# Patient Record
Sex: Male | Born: 1990 | Race: White | Hispanic: No | Marital: Single
Health system: Southern US, Community
[De-identification: ages and names within clinical notes are randomized; demographics above are authoritative.]

---
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
## 2013-02-26 ENCOUNTER — Emergency Department: Payer: Self-pay | Admitting: Emergency Medicine

## 2014-06-06 IMAGING — CR LEFT INDEX FINGER 2+V
1 series · 3 of 3 positions shown · non-contrast
Comparison: none

REASON FOR EXAM: crush injury
COMMENTS:

[Series 1: x finger pa left · 0.14mm/px · 3 of 3 slices shown]
[im 1/3]
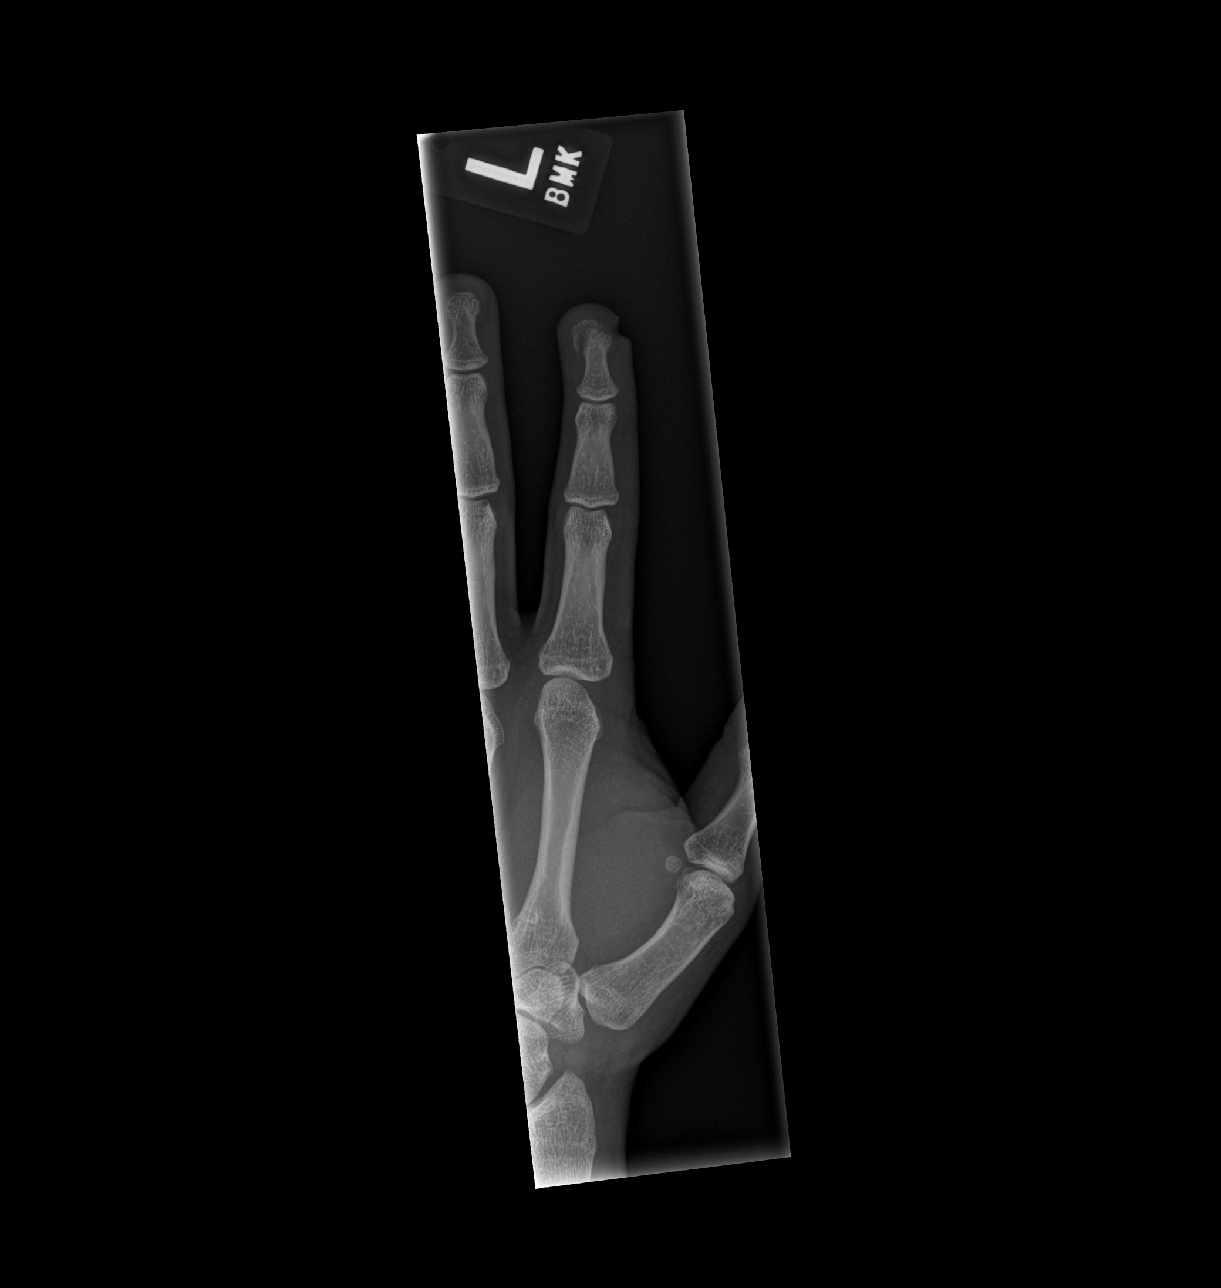
[im 2/3]
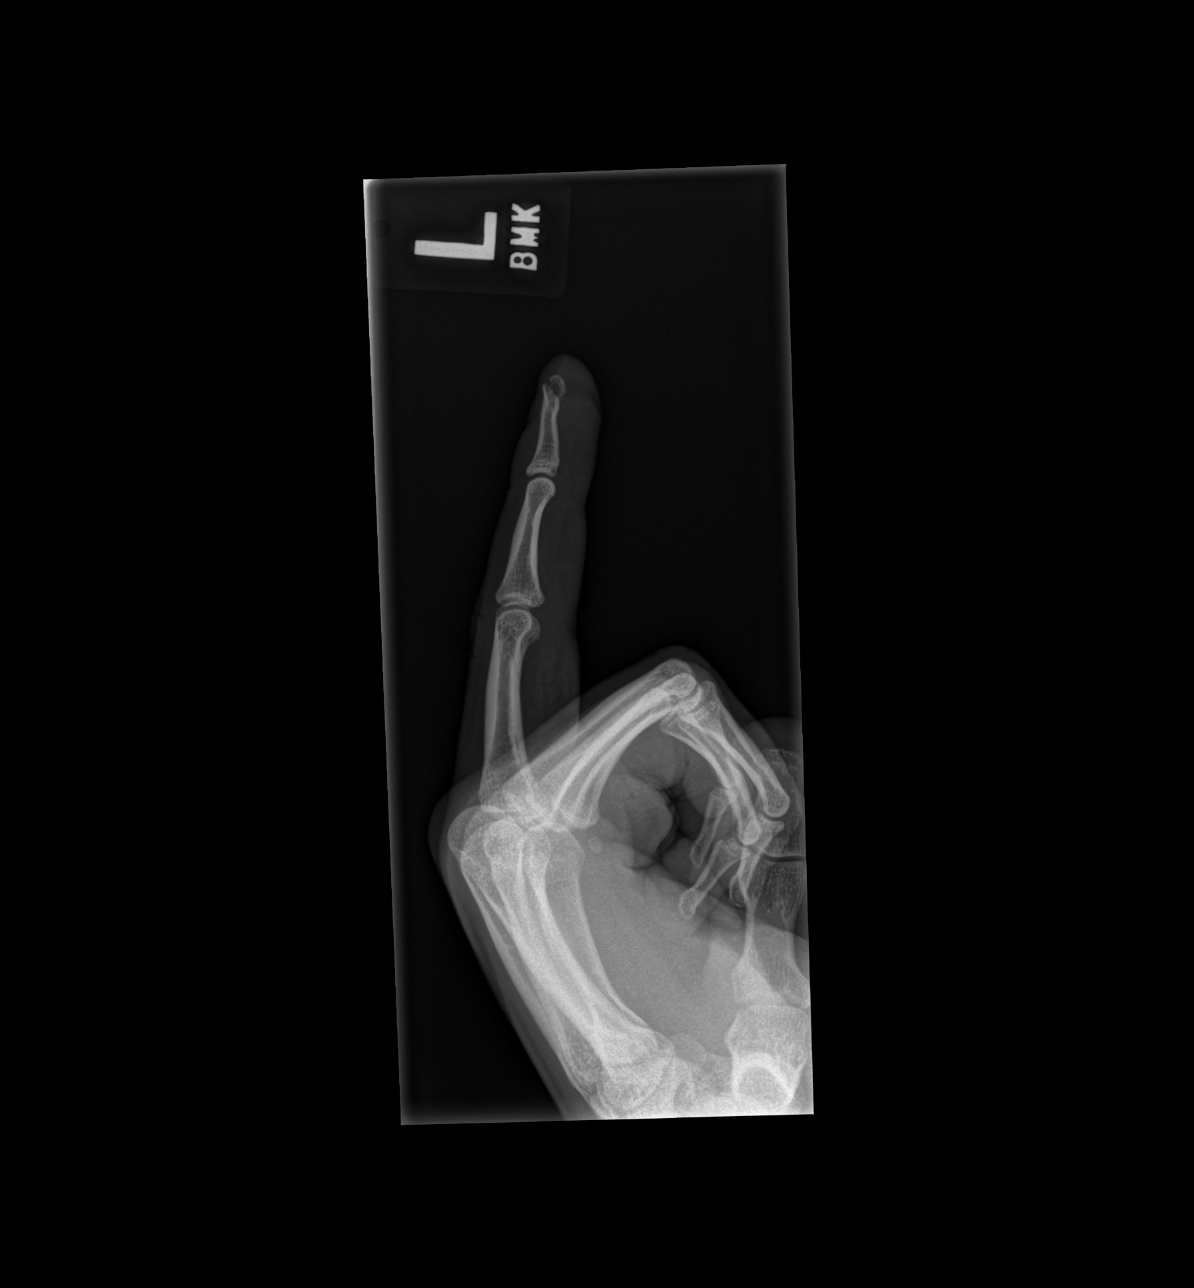
[im 3/3]
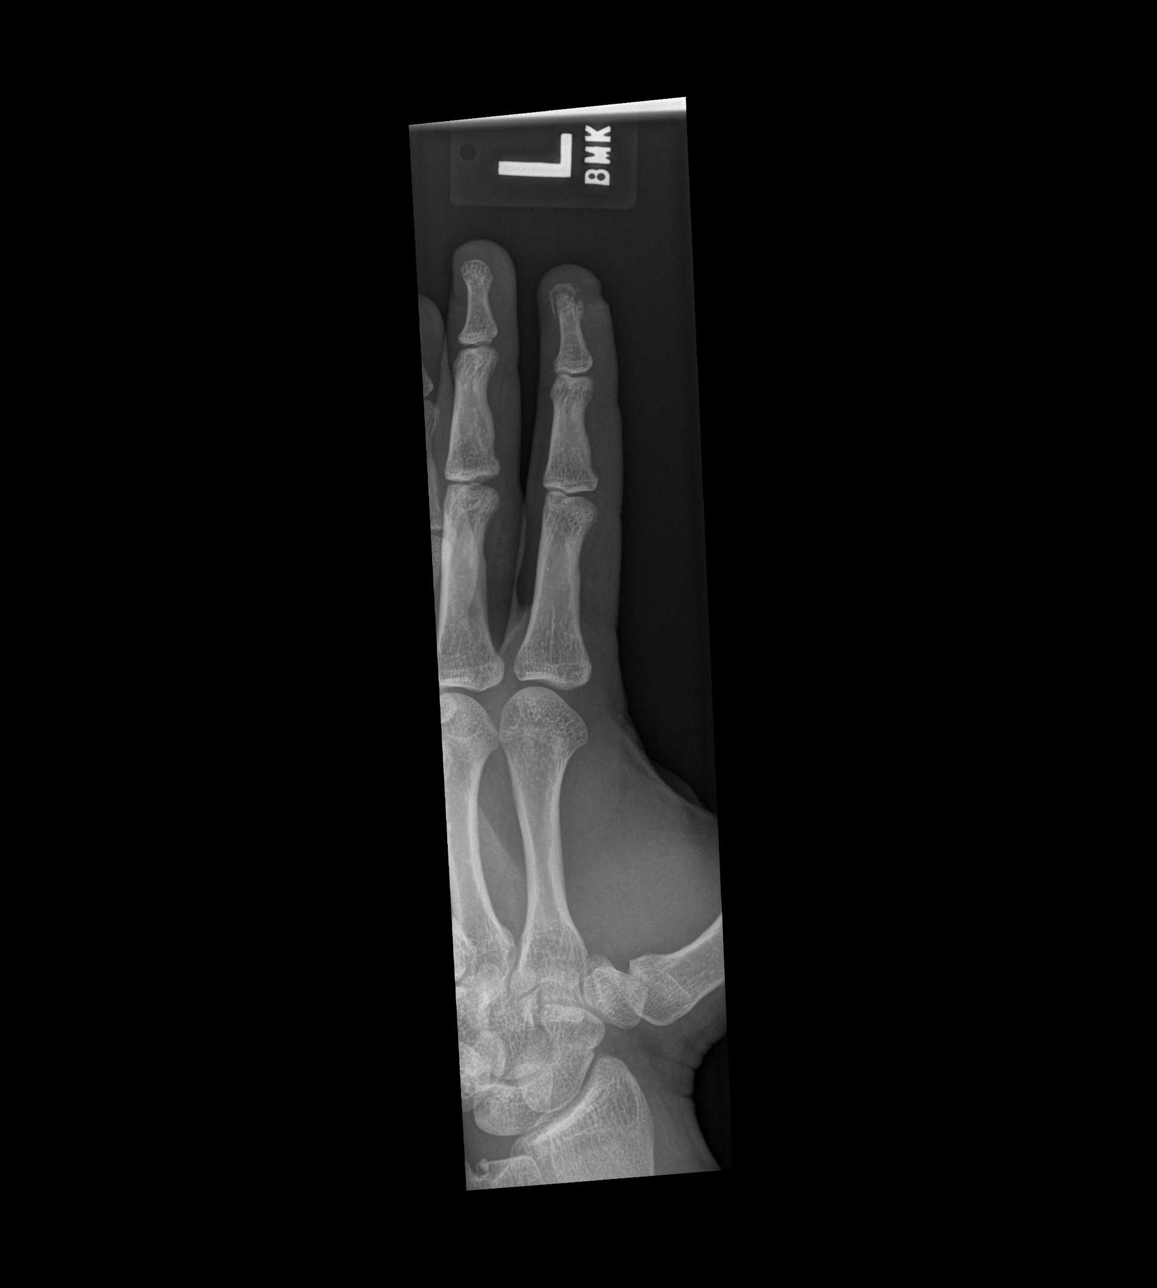

[3 of 3 positions shown; findings below may reference images not displayed]

PROCEDURE:     DXR - DXR FINGER INDEX 2ND DIGIT LT HA  - February 26, 2013  [DATE]

RESULT:     Three views of the left index finger are submitted. There is an
open fracture the tuft of the distal phalanx. The fracture fragment is
mildly distracted from the underlying donor site. The proximal and middle
phalanges appear normal as do the interphalangeal joints.
IMPRESSION: The patient sustained an open fracture of the tuft of the
left index finger.

[REDACTED]

## 2017-01-25 ENCOUNTER — Other Ambulatory Visit: Payer: Self-pay

## 2017-01-25 ENCOUNTER — Ambulatory Visit (INDEPENDENT_AMBULATORY_CARE_PROVIDER_SITE_OTHER): Payer: BLUE CROSS/BLUE SHIELD | Admitting: Podiatry

## 2017-01-25 DIAGNOSIS — M2580 Other specified joint disorders, unspecified joint: Secondary | ICD-10-CM | POA: Diagnosis not present

## 2017-01-25 MED ORDER — DICLOFENAC SODIUM 75 MG PO TBEC: 75.0000 mg | DELAYED_RELEASE_TABLET | Freq: Two times a day (BID) | ORAL | 0 refills | Status: AC

## 2017-01-25 NOTE — Progress Notes (Signed)
   Subjective:    Patient ID: Antonio Mcgrath, male    DOB: February 09, 1991, 26 y.o.   MRN: [id number]  HPI    Review of Systems  All other systems reviewed and are negative.      Objective:   Physical Exam        Assessment & Plan:

## 2017-01-28 NOTE — Progress Notes (Signed)
   HPI: Patient is a 26 year old male presenting today as a new patient with a complaint of pain to the left great toe, forefoot and medial left foot onset 2 months ago. Wearing shoes increases his pain. He has not done anything to treat his symptoms. He is here for further evaluation and treatment.   No past medical history on file.   Physical Exam: General: The patient is alert and oriented x3 in no acute distress.  Dermatology: Skin is warm, dry and supple bilateral lower extremities. Negative for open lesions or macerations.  Vascular: Palpable pedal pulses bilaterally. No edema or erythema noted. Capillary refill within normal limits.  Neurological: Epicritic and protective threshold grossly intact bilaterally.   Musculoskeletal Exam: .Range of motion within normal limits to all pedal and ankle joints bilateral. Muscle strength 5/5 in all groups bilateral. Pain on palpation noted to the sesamoidal apparatus the left foot. Pain with range of motion of the first MTPJ left   Assessment: -  Sesamoiditis left   Plan of Care:  - Patient evaluated. - Injection of 0.5 mLs of Celestone Soluspan injected into the sesamoidal apparatus. - Offloading met pads dispensed. - Prescription for diclofenac given to patient. - Discussed modifying insoles to offload the sesamoids. Also discussed custom molded orthotics.  - Return to clinic in 4 weeks.   Edrick Kins, DPM [facility]  Dr. Edrick Kins, DPM    [address], [place] [address]                Office [phone]  Fax [phone]

## 2017-02-01 MED ORDER — BETAMETHASONE SOD PHOS & ACET 6 (3-3) MG/ML IJ SUSP: 3.0000 mg | Freq: Once | INTRAMUSCULAR | Status: AC

## 2017-02-22 ENCOUNTER — Ambulatory Visit: Payer: BLUE CROSS/BLUE SHIELD | Admitting: Podiatry

## 2017-06-14 ENCOUNTER — Ambulatory Visit: Payer: BLUE CROSS/BLUE SHIELD | Admitting: Podiatry

## 2017-06-28 ENCOUNTER — Encounter: Payer: Self-pay | Admitting: Podiatry

## 2017-06-28 ENCOUNTER — Ambulatory Visit (INDEPENDENT_AMBULATORY_CARE_PROVIDER_SITE_OTHER): Payer: BLUE CROSS/BLUE SHIELD | Admitting: Podiatry

## 2017-06-28 DIAGNOSIS — M2580 Other specified joint disorders, unspecified joint: Secondary | ICD-10-CM

## 2017-06-28 MED ORDER — MELOXICAM 15 MG PO TABS: 15.0000 mg | ORAL_TABLET | Freq: Every day | ORAL | 3 refills | Status: AC

## 2017-06-28 MED ORDER — METHYLPREDNISOLONE 4 MG PO TBPK: ORAL_TABLET | ORAL | 0 refills | Status: AC

## 2017-07-01 NOTE — Progress Notes (Signed)
   HPI: Patient is a 27 year old male presenting today for follow up evaluation of sesamoiditis of the left foot. He states the pain improved since his last visit but returned after about two months. Bending the toes increases the pain. He has not done anything to treat the symptoms. Patient is here for further evaluation and treatment.   No past medical history on file.   Physical Exam: General: The patient is alert and oriented x3 in no acute distress.  Dermatology: Skin is warm, dry and supple bilateral lower extremities. Negative for open lesions or macerations.  Vascular: Palpable pedal pulses bilaterally. No edema or erythema noted. Capillary refill within normal limits.  Neurological: Epicritic and protective threshold grossly intact bilaterally.   Musculoskeletal Exam: .Range of motion within normal limits to all pedal and ankle joints bilateral. Muscle strength 5/5 in all groups bilateral. Pain on palpation noted to the sesamoidal apparatus the left foot. Pain with range of motion of the first MTPJ left   Assessment: -  Sesamoiditis left   Plan of Care:  - Patient evaluated. - Injection of 0.5 mLs of Celestone Soluspan injected into the sesamoidal apparatus left. - Prescription for Mobic given to patient. - Prescription for Medrol Dose Pak provided to patient.  - Patient opts not to use a post op shoe.  - Return to clinic in 4 weeks.   Felecia ShellingBrent M. Evans, DPM [facility]  Dr. Felecia ShellingBrent M. Evans, DPM    [address]                                        [place], [place] [id number]                Office [phone]  Fax [phone]

## 2017-07-26 ENCOUNTER — Ambulatory Visit: Payer: BLUE CROSS/BLUE SHIELD | Admitting: Podiatry

## 2017-08-16 ENCOUNTER — Encounter: Payer: Self-pay | Admitting: Podiatry

## 2017-08-16 ENCOUNTER — Ambulatory Visit (INDEPENDENT_AMBULATORY_CARE_PROVIDER_SITE_OTHER): Payer: BLUE CROSS/BLUE SHIELD | Admitting: Podiatry

## 2017-08-16 DIAGNOSIS — M2580 Other specified joint disorders, unspecified joint: Secondary | ICD-10-CM | POA: Diagnosis not present

## 2017-08-21 NOTE — Progress Notes (Signed)
HPI: Patient is a 27-year-old male presenting today for follow up evaluation of sesamoiditis of the left foot. He states the pain has improved but has not completely resolved. He reports some continued intermittent pain. He has completed the Medrol Dose Pak. He states the injection he received helped for a few days. He has been taking the Mobic as directed which also helps alleviate the pain. Patient is here for further evaluation and treatment.   No past medical history on file.   Physical Exam: General: The patient is alert and oriented x3 in no acute distress.  Dermatology: Skin is warm, dry and supple bilateral lower extremities. Negative for open lesions or macerations.  Vascular: Palpable pedal pulses bilaterally. No edema or erythema noted. Capillary refill within normal limits.  Neurological: Epicritic and protective threshold grossly intact bilaterally.   Musculoskeletal Exam: .Range of motion within normal limits to all pedal and ankle joints bilateral. Muscle strength 5/5 in all groups bilateral. Pain on palpation noted to the sesamoidal apparatus the left foot. Pain with range of motion of the first MTPJ left   Assessment: -  Sesamoiditis left - 70% improved   Plan of Care:  - Patient evaluated. - Continue taking Mobic 15 mg as directed.  - Recommended good shoe gear.  - Continue offloading met pads.  - Return to clinic as needed.    Brent M. Evans, DPM [facility]  Dr. Brent M. Evans, DPM    [address]                                        [place], [place] [address]                Office [phone]  Fax [phone]
